# Patient Record
Sex: Female | Born: 1965 | Race: White | Hispanic: No | Marital: Married | Smoking: Never smoker
Health system: Southern US, Community
[De-identification: ages and names within clinical notes are randomized; demographics above are authoritative.]

## PROBLEM LIST (undated history)

## (undated) DIAGNOSIS — G43909 Migraine, unspecified, not intractable, without status migrainosus: Secondary | ICD-10-CM

## (undated) DIAGNOSIS — T7840XA Allergy, unspecified, initial encounter: Secondary | ICD-10-CM

## (undated) DIAGNOSIS — C4490 Unspecified malignant neoplasm of skin, unspecified: Secondary | ICD-10-CM

## (undated) HISTORY — DX: Migraine, unspecified, not intractable, without status migrainosus: G43.909

## (undated) HISTORY — PX: WISDOM TOOTH EXTRACTION: SHX21

## (undated) HISTORY — DX: Unspecified malignant neoplasm of skin, unspecified: C44.90

## (undated) HISTORY — PX: SKIN CANCER EXCISION: SHX779

## (undated) HISTORY — DX: Allergy, unspecified, initial encounter: T78.40XA

---

## 1998-08-18 ENCOUNTER — Other Ambulatory Visit: Admission: RE | Admit: 1998-08-18 | Discharge: 1998-08-18 | Payer: Self-pay | Admitting: Family Medicine

## 1999-09-03 ENCOUNTER — Other Ambulatory Visit: Admission: RE | Admit: 1999-09-03 | Discharge: 1999-09-03 | Payer: Self-pay | Admitting: Family Medicine

## 2000-12-08 ENCOUNTER — Other Ambulatory Visit: Admission: RE | Admit: 2000-12-08 | Discharge: 2000-12-08 | Payer: Self-pay | Admitting: Family Medicine

## 2003-08-02 ENCOUNTER — Encounter: Admission: RE | Admit: 2003-08-02 | Discharge: 2003-08-02 | Payer: Self-pay | Admitting: *Deleted

## 2004-11-06 ENCOUNTER — Encounter: Admission: RE | Admit: 2004-11-06 | Discharge: 2004-11-06 | Payer: Self-pay | Admitting: Family Medicine

## 2006-09-30 ENCOUNTER — Encounter: Admission: RE | Admit: 2006-09-30 | Discharge: 2006-09-30 | Payer: Self-pay | Admitting: Internal Medicine

## 2006-10-07 ENCOUNTER — Encounter: Admission: RE | Admit: 2006-10-07 | Discharge: 2006-10-07 | Payer: Self-pay | Admitting: Internal Medicine

## 2007-12-01 ENCOUNTER — Encounter: Admission: RE | Admit: 2007-12-01 | Discharge: 2007-12-01 | Payer: Self-pay | Admitting: Unknown Physician Specialty

## 2009-01-09 ENCOUNTER — Encounter: Admission: RE | Admit: 2009-01-09 | Discharge: 2009-01-09 | Payer: Self-pay | Admitting: Unknown Physician Specialty

## 2010-01-16 ENCOUNTER — Encounter: Admission: RE | Admit: 2010-01-16 | Discharge: 2010-01-16 | Payer: Self-pay | Admitting: Unknown Physician Specialty

## 2010-12-30 ENCOUNTER — Other Ambulatory Visit: Payer: Self-pay | Admitting: Family Medicine

## 2010-12-30 DIAGNOSIS — Z1231 Encounter for screening mammogram for malignant neoplasm of breast: Secondary | ICD-10-CM

## 2011-02-04 ENCOUNTER — Ambulatory Visit
Admission: RE | Admit: 2011-02-04 | Discharge: 2011-02-04 | Disposition: A | Discharge status: Home / Self Care | Payer: 59 | Source: Ambulatory Visit | Attending: Family Medicine | Admitting: Family Medicine

## 2011-02-04 DIAGNOSIS — Z1231 Encounter for screening mammogram for malignant neoplasm of breast: Secondary | ICD-10-CM

## 2011-02-10 ENCOUNTER — Other Ambulatory Visit: Payer: Self-pay | Admitting: Family Medicine

## 2011-02-10 DIAGNOSIS — R928 Other abnormal and inconclusive findings on diagnostic imaging of breast: Secondary | ICD-10-CM

## 2011-03-03 ENCOUNTER — Ambulatory Visit
Admission: RE | Admit: 2011-03-03 | Discharge: 2011-03-03 | Disposition: A | Discharge status: Home / Self Care | Payer: 59 | Source: Ambulatory Visit | Attending: Family Medicine | Admitting: Family Medicine

## 2011-03-03 DIAGNOSIS — R928 Other abnormal and inconclusive findings on diagnostic imaging of breast: Secondary | ICD-10-CM

## 2012-03-30 ENCOUNTER — Other Ambulatory Visit: Payer: Self-pay | Admitting: Family Medicine

## 2012-03-30 DIAGNOSIS — Z1231 Encounter for screening mammogram for malignant neoplasm of breast: Secondary | ICD-10-CM

## 2012-04-25 ENCOUNTER — Ambulatory Visit
Admission: RE | Admit: 2012-04-25 | Discharge: 2012-04-25 | Disposition: A | Discharge status: Home / Self Care | Payer: 59 | Source: Ambulatory Visit | Attending: Family Medicine | Admitting: Family Medicine

## 2012-04-26 ENCOUNTER — Other Ambulatory Visit: Payer: Self-pay | Admitting: Family Medicine

## 2012-05-08 ENCOUNTER — Ambulatory Visit
Admission: RE | Admit: 2012-05-08 | Discharge: 2012-05-08 | Disposition: A | Discharge status: Home / Self Care | Payer: 59 | Source: Ambulatory Visit | Attending: Family Medicine | Admitting: Family Medicine

## 2012-05-08 DIAGNOSIS — R928 Other abnormal and inconclusive findings on diagnostic imaging of breast: Secondary | ICD-10-CM

## 2013-04-06 ENCOUNTER — Other Ambulatory Visit: Payer: Self-pay

## 2013-04-06 DIAGNOSIS — Z1231 Encounter for screening mammogram for malignant neoplasm of breast: Secondary | ICD-10-CM

## 2013-04-30 ENCOUNTER — Ambulatory Visit
Admission: RE | Admit: 2013-04-30 | Discharge: 2013-04-30 | Disposition: A | Discharge status: Home / Self Care | Payer: 59 | Source: Ambulatory Visit

## 2013-04-30 DIAGNOSIS — Z1231 Encounter for screening mammogram for malignant neoplasm of breast: Secondary | ICD-10-CM

## 2014-02-26 ENCOUNTER — Encounter: Payer: Self-pay | Admitting: Internal Medicine

## 2014-04-24 ENCOUNTER — Ambulatory Visit (AMBULATORY_SURGERY_CENTER): Payer: Self-pay

## 2014-04-24 VITALS — Ht 63.0 in | Wt 148.0 lb

## 2014-04-24 DIAGNOSIS — Z1211 Encounter for screening for malignant neoplasm of colon: Secondary | ICD-10-CM

## 2014-04-24 MED ORDER — MOVIPREP 100 G PO SOLR: 1.0000 | Freq: Once | ORAL | Status: DC

## 2014-04-24 NOTE — Progress Notes (Signed)
No allergies to eggs or soy No past problems with anesthesia No home oxygen No diet/weight loss meds  Has email  Emmi instructions given for colonoscopy 

## 2014-05-07 ENCOUNTER — Encounter: Payer: Self-pay | Admitting: Internal Medicine

## 2014-05-07 ENCOUNTER — Ambulatory Visit (AMBULATORY_SURGERY_CENTER): Payer: 59 | Admitting: Internal Medicine

## 2014-05-07 VITALS — BP 157/93 | HR 70 | Temp 97.4°F | Resp 15 | Ht 63.0 in | Wt 148.0 lb

## 2014-05-07 DIAGNOSIS — Z1211 Encounter for screening for malignant neoplasm of colon: Secondary | ICD-10-CM

## 2014-05-07 MED ORDER — SODIUM CHLORIDE 0.9 % IV SOLN: 500.0000 mL | INTRAVENOUS | Status: DC

## 2014-05-07 NOTE — Progress Notes (Signed)
Pt awake and alert, pleased with MAC

## 2014-05-07 NOTE — Progress Notes (Signed)
No problems noted in the recovery room. maw 

## 2014-05-07 NOTE — Patient Instructions (Signed)
YOU HAD AN ENDOSCOPIC PROCEDURE TODAY AT [facility]:   Refer to the procedure report that was given to you for any specific questions about what was found during the examination.  If the procedure report does not answer your questions, please call your gastroenterologist to clarify.  If you requested that your care partner not be given the details of your procedure findings, then the procedure report has been included in a sealed envelope for you to review at your convenience later.  YOU SHOULD EXPECT: Some feelings of bloating in the abdomen. Passage of more gas than usual.  Walking can help get rid of the air that was put into your GI tract during the procedure and reduce the bloating. If you had a lower endoscopy (such as a colonoscopy or flexible sigmoidoscopy) you may notice spotting of blood in your stool or on the toilet paper. If you underwent a bowel prep for your procedure, you may not have a normal bowel movement for a few days.  Please Note:  You might notice some irritation and congestion in your nose or some drainage.  This is from the oxygen used during your procedure.  There is no need for concern and it should clear up in a day or so.  SYMPTOMS TO REPORT IMMEDIATELY:   Following lower endoscopy (colonoscopy or flexible sigmoidoscopy):  Excessive amounts of blood in the stool  Significant tenderness or worsening of abdominal pains  Swelling of the abdomen that is new, acute  Fever of 100F or higher   For urgent or emergent issues, a gastroenterologist can be reached at any hour by calling [phone].   DIET: Your first meal following the procedure should be a small meal and then it is ok to progress to your normal diet. Heavy or fried foods are harder to digest and may make you feel nauseous or bloated.  Likewise, meals heavy in dairy and vegetables can increase bloating.  Drink plenty of fluids but you should avoid alcoholic beverages for 24  hours.  ACTIVITY:  You should plan to take it easy for the rest of today and you should NOT DRIVE or use heavy machinery until tomorrow (because of the sedation medicines used during the test).    FOLLOW UP: Our staff will call the number listed on your records the next business day following your procedure to check on you and address any questions or concerns that you may have regarding the information given to you following your procedure. If we do not reach you, we will leave a message.  However, if you are feeling well and you are not experiencing any problems, there is no need to return our call.  We will assume that you have returned to your regular daily activities without incident.  If any biopsies were taken you will be contacted by phone or by letter within the next 1-3 weeks.  Please call us at [phone] if you have not heard about the biopsies in 3 weeks.    SIGNATURES/CONFIDENTIALITY: You and/or your care partner have signed paperwork which will be entered into your electronic medical record.  These signatures attest to the fact that that the information above on your After Visit Summary has been reviewed and is understood.  Full responsibility of the confidentiality of this discharge information lies with you and/or your care-partner.    Handout was given to your care partner on a high fiber diet with liberal fluid intake You might notice some irritation in  your nose or drainage.  This may cause feelings of congestion.  This is from the oxygen, which can be drying.  This is no cause for concern; this should clear up in a few days.  You may resume your current medications today. Please call if any questions or concerns.

## 2014-05-07 NOTE — Progress Notes (Signed)
No egg or soy allergy. No issues with past sedation but pt states never been put all the way asleep before No diet pills No home 02 use

## 2014-05-07 NOTE — Op Note (Signed)
[facility]  [address] [place], [id number]   COLONOSCOPY PROCEDURE REPORT  PATIENT: Gina Wyatt, Gina Wyatt  MR#: [id number] BIRTHDATE: 1965/06/16 , 48  yrs. old GENDER: female ENDOSCOPIST: Lafayette Dragon, MD REFERRED BY:Dr L.Helene Kelp, Dr Sundra Aland PROCEDURE DATE:  05/07/2014 PROCEDURE:   Colonoscopy, screening First Screening Colonoscopy - Avg.  risk and is 50 yrs.  old or older Yes.  Prior Negative Screening - Now for repeat screening. N/A  History of Adenoma - Now for follow-up colonoscopy & has been > or = to 3 yrs.  N/A ASA CLASS:   Class I INDICATIONS:Screening for colonic neoplasia and Colorectal Neoplasm Risk Assessment for this procedure is average risk. MEDICATIONS: Monitored anesthesia care and Propofol 250 mg IV  DESCRIPTION OF PROCEDURE:   After the risks benefits and alternatives of the procedure were thoroughly explained, informed consent was obtained.  The digital rectal exam revealed no abnormalities of the rectum.   The LB PCF Q180 [id number]  endoscope was introduced through the anus and advanced to the cecum, which was identified by both the appendix and ileocecal valve. No adverse events experienced.   The quality of the prep was excellent. (MoviPrep was used)  The instrument was then slowly withdrawn as the colon was fully examined.      COLON FINDINGS: A normal appearing cecum, ileocecal valve, and appendiceal orifice were identified.  The ascending, transverse, descending, sigmoid colon, and rectum appeared unremarkable. Retroflexed views revealed no abnormalities. The time to cecum = 5.14 Withdrawal time = 6.00   The scope was withdrawn and the procedure completed. COMPLICATIONS: There were no immediate complications.  ENDOSCOPIC IMPRESSION: Normal colonoscopy  RECOMMENDATIONS: High fiber diet Recall colonoscopy in 10 years  eSigned:  Lafayette Dragon, MD 05/07/2014 9:43 AM   cc:

## 2014-05-08 ENCOUNTER — Telehealth: Payer: Self-pay | Admitting: *Deleted

## 2014-05-08 NOTE — Telephone Encounter (Signed)
  Follow up Call-  Call back number 05/07/2014  Post procedure Call Back phone  # [phone]  Permission to leave phone message Yes     Patient questions:  Do you have a fever, pain , or abdominal swelling? No. Pain Score  0 *  Have you tolerated food without any problems? Yes.    Have you been able to return to your normal activities? Yes.    Do you have any questions about your discharge instructions: Diet   No. Medications  No. Follow up visit  No.  Do you have questions or concerns about your Care? No.  Actions: * If pain score is 4 or above: No action needed, pain <4.

## 2014-06-13 ENCOUNTER — Other Ambulatory Visit: Payer: Self-pay

## 2014-06-13 DIAGNOSIS — Z1231 Encounter for screening mammogram for malignant neoplasm of breast: Secondary | ICD-10-CM

## 2014-06-17 ENCOUNTER — Ambulatory Visit
Admission: RE | Admit: 2014-06-17 | Discharge: 2014-06-17 | Disposition: A | Discharge status: Home / Self Care | Payer: 59 | Source: Ambulatory Visit

## 2014-06-17 DIAGNOSIS — Z1231 Encounter for screening mammogram for malignant neoplasm of breast: Secondary | ICD-10-CM

## 2015-07-09 ENCOUNTER — Other Ambulatory Visit: Payer: Self-pay

## 2015-07-09 DIAGNOSIS — Z1231 Encounter for screening mammogram for malignant neoplasm of breast: Secondary | ICD-10-CM

## 2015-07-14 ENCOUNTER — Ambulatory Visit: Payer: 59

## 2015-07-28 ENCOUNTER — Ambulatory Visit
Admission: RE | Admit: 2015-07-28 | Discharge: 2015-07-28 | Disposition: A | Discharge status: Home / Self Care | Payer: Commercial Managed Care - HMO | Source: Ambulatory Visit

## 2015-07-28 DIAGNOSIS — Z1231 Encounter for screening mammogram for malignant neoplasm of breast: Secondary | ICD-10-CM

## 2016-03-10 DIAGNOSIS — Z1389 Encounter for screening for other disorder: Secondary | ICD-10-CM | POA: Diagnosis not present

## 2016-03-10 DIAGNOSIS — Z0000 Encounter for general adult medical examination without abnormal findings: Secondary | ICD-10-CM | POA: Diagnosis not present

## 2016-03-24 DIAGNOSIS — Z23 Encounter for immunization: Secondary | ICD-10-CM | POA: Diagnosis not present

## 2016-08-31 ENCOUNTER — Other Ambulatory Visit: Payer: Self-pay | Admitting: Family Medicine

## 2016-08-31 DIAGNOSIS — Z1231 Encounter for screening mammogram for malignant neoplasm of breast: Secondary | ICD-10-CM

## 2016-09-29 ENCOUNTER — Ambulatory Visit
Admission: RE | Admit: 2016-09-29 | Discharge: 2016-09-29 | Disposition: A | Discharge status: Home / Self Care | Payer: 59 | Source: Ambulatory Visit | Attending: Family Medicine | Admitting: Family Medicine

## 2016-09-29 DIAGNOSIS — Z1231 Encounter for screening mammogram for malignant neoplasm of breast: Secondary | ICD-10-CM | POA: Diagnosis not present

## 2017-02-23 DIAGNOSIS — E559 Vitamin D deficiency, unspecified: Secondary | ICD-10-CM | POA: Diagnosis not present

## 2017-02-23 DIAGNOSIS — G43009 Migraine without aura, not intractable, without status migrainosus: Secondary | ICD-10-CM | POA: Diagnosis not present

## 2017-02-23 DIAGNOSIS — N951 Menopausal and female climacteric states: Secondary | ICD-10-CM | POA: Diagnosis not present

## 2017-03-21 DIAGNOSIS — Z23 Encounter for immunization: Secondary | ICD-10-CM | POA: Diagnosis not present

## 2017-03-21 DIAGNOSIS — Z0000 Encounter for general adult medical examination without abnormal findings: Secondary | ICD-10-CM | POA: Diagnosis not present

## 2017-04-15 DIAGNOSIS — J329 Chronic sinusitis, unspecified: Secondary | ICD-10-CM | POA: Diagnosis not present

## 2017-05-23 DIAGNOSIS — J329 Chronic sinusitis, unspecified: Secondary | ICD-10-CM | POA: Diagnosis not present

## 2017-09-22 ENCOUNTER — Other Ambulatory Visit: Payer: Self-pay | Admitting: Family Medicine

## 2017-09-22 DIAGNOSIS — Z1231 Encounter for screening mammogram for malignant neoplasm of breast: Secondary | ICD-10-CM

## 2017-10-20 ENCOUNTER — Ambulatory Visit: Payer: 59

## 2017-11-01 ENCOUNTER — Ambulatory Visit
Admission: RE | Admit: 2017-11-01 | Discharge: 2017-11-01 | Disposition: A | Discharge status: Home / Self Care | Payer: 59 | Source: Ambulatory Visit | Attending: Family Medicine | Admitting: Family Medicine

## 2017-11-01 DIAGNOSIS — Z1231 Encounter for screening mammogram for malignant neoplasm of breast: Secondary | ICD-10-CM | POA: Diagnosis not present

## 2018-03-23 DIAGNOSIS — Z01419 Encounter for gynecological examination (general) (routine) without abnormal findings: Secondary | ICD-10-CM | POA: Diagnosis not present

## 2018-03-23 DIAGNOSIS — Z6826 Body mass index (BMI) 26.0-26.9, adult: Secondary | ICD-10-CM | POA: Diagnosis not present

## 2018-03-23 DIAGNOSIS — Z23 Encounter for immunization: Secondary | ICD-10-CM | POA: Diagnosis not present

## 2019-03-13 ENCOUNTER — Other Ambulatory Visit: Payer: Self-pay | Admitting: Family Medicine

## 2019-03-13 ENCOUNTER — Other Ambulatory Visit: Payer: Self-pay | Admitting: Obstetrics and Gynecology

## 2019-03-13 DIAGNOSIS — Z1231 Encounter for screening mammogram for malignant neoplasm of breast: Secondary | ICD-10-CM

## 2019-04-17 ENCOUNTER — Ambulatory Visit
Admission: RE | Admit: 2019-04-17 | Discharge: 2019-04-17 | Disposition: A | Discharge status: Home / Self Care | Payer: 59 | Source: Ambulatory Visit | Attending: Family Medicine | Admitting: Family Medicine

## 2019-04-17 ENCOUNTER — Other Ambulatory Visit: Payer: Self-pay

## 2019-04-17 DIAGNOSIS — Z1231 Encounter for screening mammogram for malignant neoplasm of breast: Secondary | ICD-10-CM

## 2019-04-19 ENCOUNTER — Other Ambulatory Visit: Payer: Self-pay | Admitting: Family Medicine

## 2019-04-19 DIAGNOSIS — R928 Other abnormal and inconclusive findings on diagnostic imaging of breast: Secondary | ICD-10-CM

## 2019-05-04 ENCOUNTER — Ambulatory Visit: Payer: 59

## 2019-05-04 ENCOUNTER — Ambulatory Visit
Admission: RE | Admit: 2019-05-04 | Discharge: 2019-05-04 | Disposition: A | Discharge status: Home / Self Care | Payer: 59 | Source: Ambulatory Visit | Attending: Family Medicine | Admitting: Family Medicine

## 2019-05-04 ENCOUNTER — Other Ambulatory Visit: Payer: Self-pay

## 2019-05-04 DIAGNOSIS — R928 Other abnormal and inconclusive findings on diagnostic imaging of breast: Secondary | ICD-10-CM

## 2020-05-06 ENCOUNTER — Other Ambulatory Visit: Payer: Self-pay | Admitting: Family Medicine

## 2020-05-06 DIAGNOSIS — Z1231 Encounter for screening mammogram for malignant neoplasm of breast: Secondary | ICD-10-CM

## 2020-06-27 ENCOUNTER — Other Ambulatory Visit: Payer: Self-pay

## 2020-06-27 ENCOUNTER — Ambulatory Visit
Admission: RE | Admit: 2020-06-27 | Discharge: 2020-06-27 | Disposition: A | Discharge status: Home / Self Care | Payer: 59 | Source: Ambulatory Visit | Attending: Family Medicine | Admitting: Family Medicine

## 2020-06-27 DIAGNOSIS — Z1231 Encounter for screening mammogram for malignant neoplasm of breast: Secondary | ICD-10-CM

## 2021-06-10 ENCOUNTER — Other Ambulatory Visit: Payer: Self-pay | Admitting: Family Medicine

## 2021-06-10 DIAGNOSIS — Z1231 Encounter for screening mammogram for malignant neoplasm of breast: Secondary | ICD-10-CM

## 2021-07-14 ENCOUNTER — Ambulatory Visit
Admission: RE | Admit: 2021-07-14 | Discharge: 2021-07-14 | Disposition: A | Discharge status: Home / Self Care | Payer: 59 | Source: Ambulatory Visit | Attending: Family Medicine | Admitting: Family Medicine

## 2021-07-14 DIAGNOSIS — Z1231 Encounter for screening mammogram for malignant neoplasm of breast: Secondary | ICD-10-CM

## 2022-06-10 ENCOUNTER — Other Ambulatory Visit: Payer: Self-pay | Admitting: Family Medicine

## 2022-06-10 DIAGNOSIS — Z1231 Encounter for screening mammogram for malignant neoplasm of breast: Secondary | ICD-10-CM

## 2022-07-26 ENCOUNTER — Ambulatory Visit
Admission: RE | Admit: 2022-07-26 | Discharge: 2022-07-26 | Disposition: A | Discharge status: Home / Self Care | Payer: 59 | Source: Ambulatory Visit | Attending: Family Medicine | Admitting: Family Medicine

## 2022-07-26 DIAGNOSIS — Z1231 Encounter for screening mammogram for malignant neoplasm of breast: Secondary | ICD-10-CM

## 2023-06-22 ENCOUNTER — Other Ambulatory Visit: Payer: Self-pay | Admitting: Family Medicine

## 2023-06-22 DIAGNOSIS — Z1231 Encounter for screening mammogram for malignant neoplasm of breast: Secondary | ICD-10-CM

## 2023-08-02 ENCOUNTER — Ambulatory Visit
Admission: RE | Admit: 2023-08-02 | Discharge: 2023-08-02 | Disposition: A | Discharge status: Home / Self Care | Source: Ambulatory Visit | Attending: Family Medicine | Admitting: Family Medicine

## 2023-08-02 DIAGNOSIS — Z1231 Encounter for screening mammogram for malignant neoplasm of breast: Secondary | ICD-10-CM

## 2023-08-05 ENCOUNTER — Other Ambulatory Visit: Payer: Self-pay | Admitting: Family Medicine

## 2023-08-05 DIAGNOSIS — R928 Other abnormal and inconclusive findings on diagnostic imaging of breast: Secondary | ICD-10-CM

## 2023-08-17 ENCOUNTER — Ambulatory Visit
Admission: RE | Admit: 2023-08-17 | Discharge: 2023-08-17 | Discharge status: Home / Self Care | Source: Ambulatory Visit | Attending: Family Medicine | Admitting: Family Medicine

## 2023-08-17 DIAGNOSIS — R928 Other abnormal and inconclusive findings on diagnostic imaging of breast: Secondary | ICD-10-CM

## 2023-11-19 IMAGING — MG MM DIGITAL SCREENING BILAT W/ TOMO AND CAD
8 series · 9 of 24 positions shown · non-contrast
Comparison: Previous exam(s).

CLINICAL DATA: Screening.

EXAM:
DIGITAL SCREENING BILATERAL MAMMOGRAM WITH TOMOSYNTHESIS AND CAD
TECHNIQUE: Bilateral screening digital craniocaudal and mediolateral oblique
mammograms were obtained. Bilateral screening digital breast
tomosynthesis was performed. The images were evaluated with
computer-aided detection.

[R CC synth-2D]
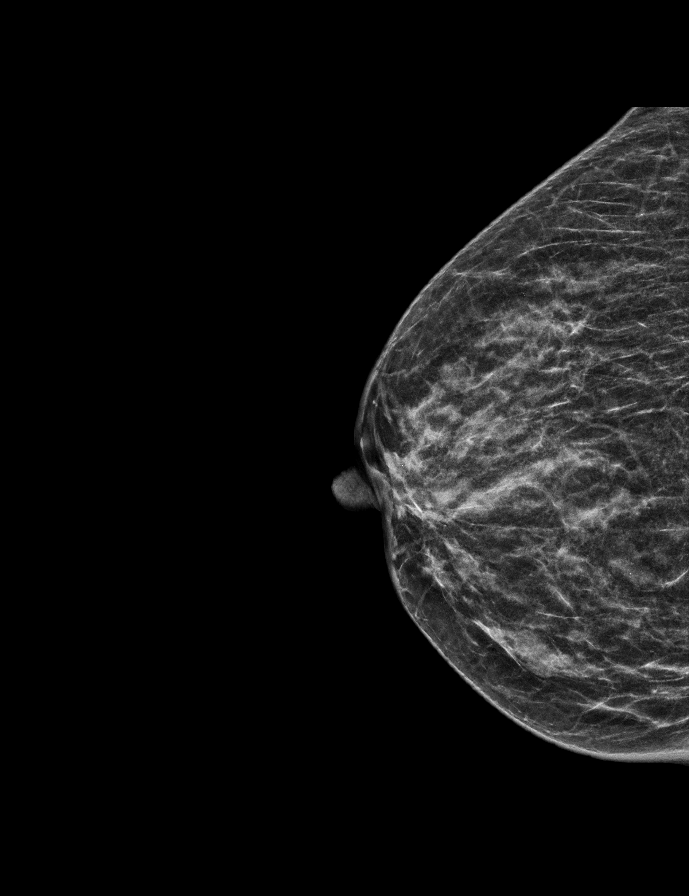

[L CC synth-2D]
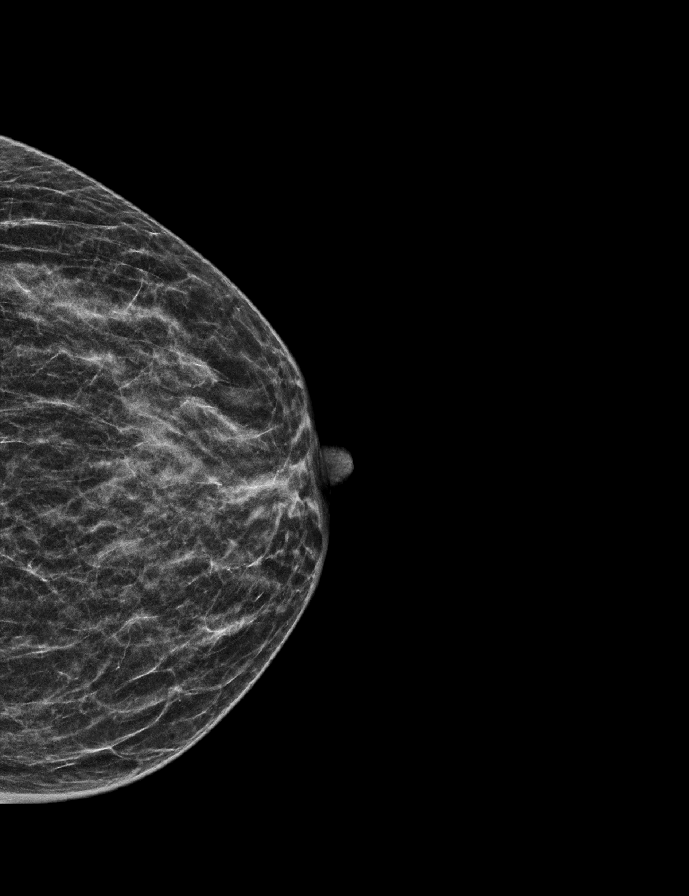

[R MLO synth-2D]
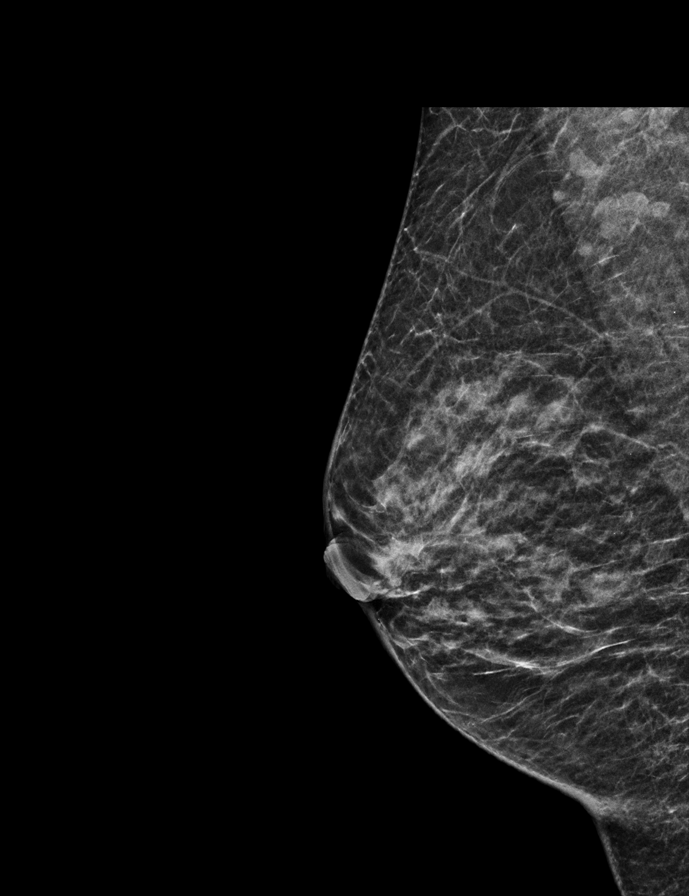

[L MLO synth-2D]
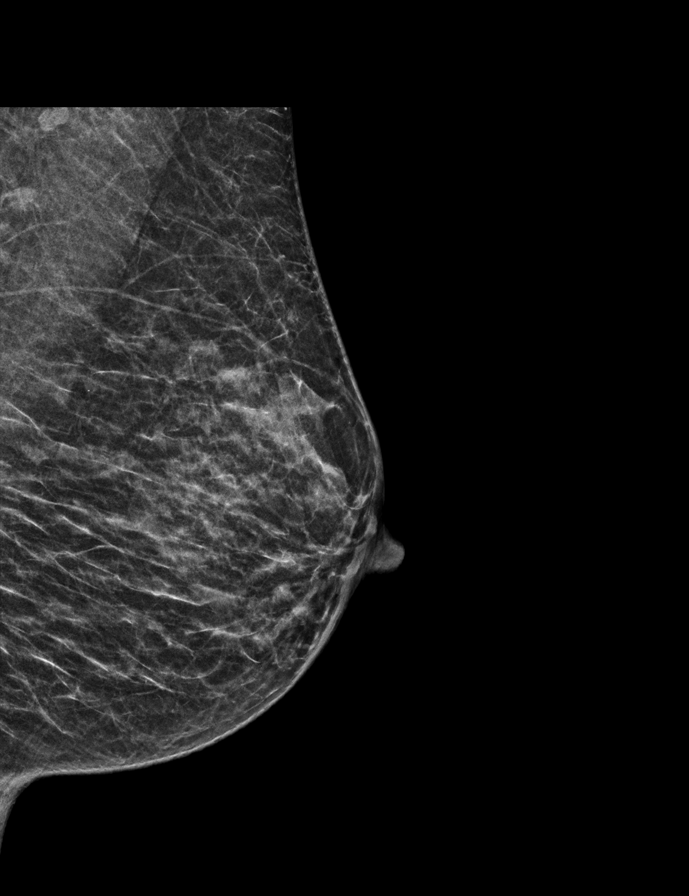

[R MLO tomo · 2 of 35 frames shown]
[frame 12/35]
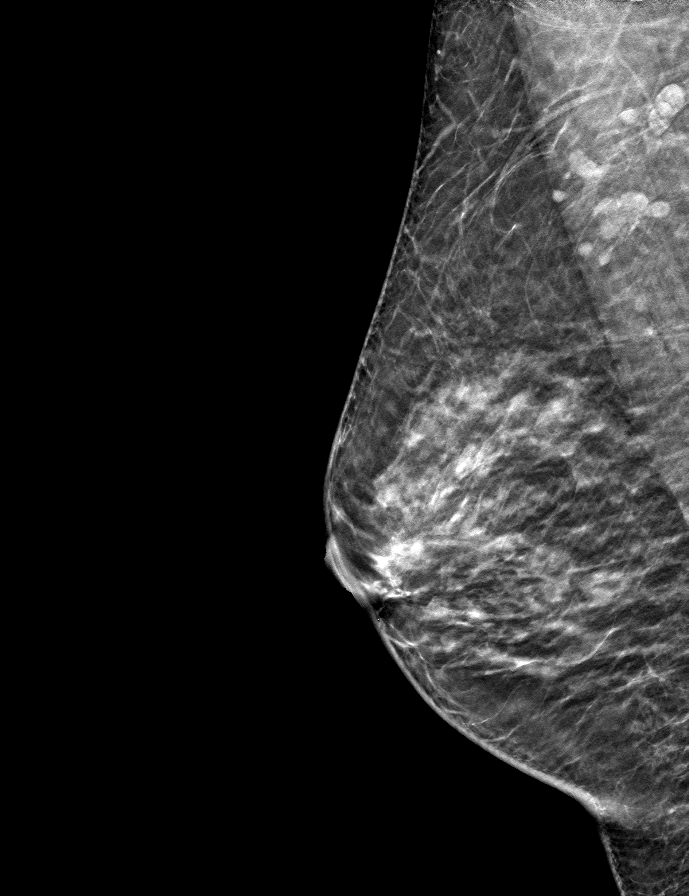
[frame 18/35]
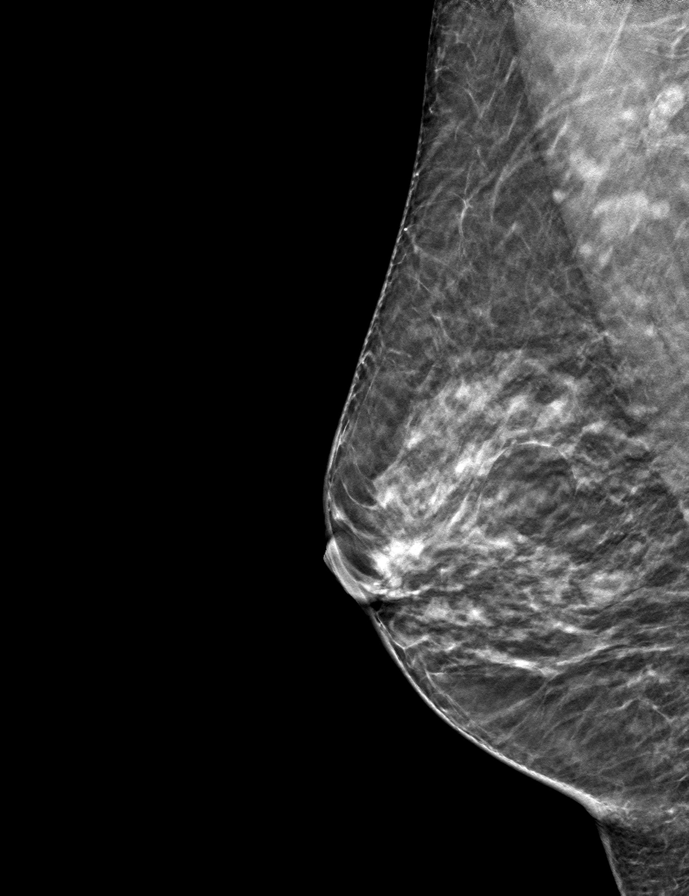

[L MLO tomo · tomo slice 18/35.0]
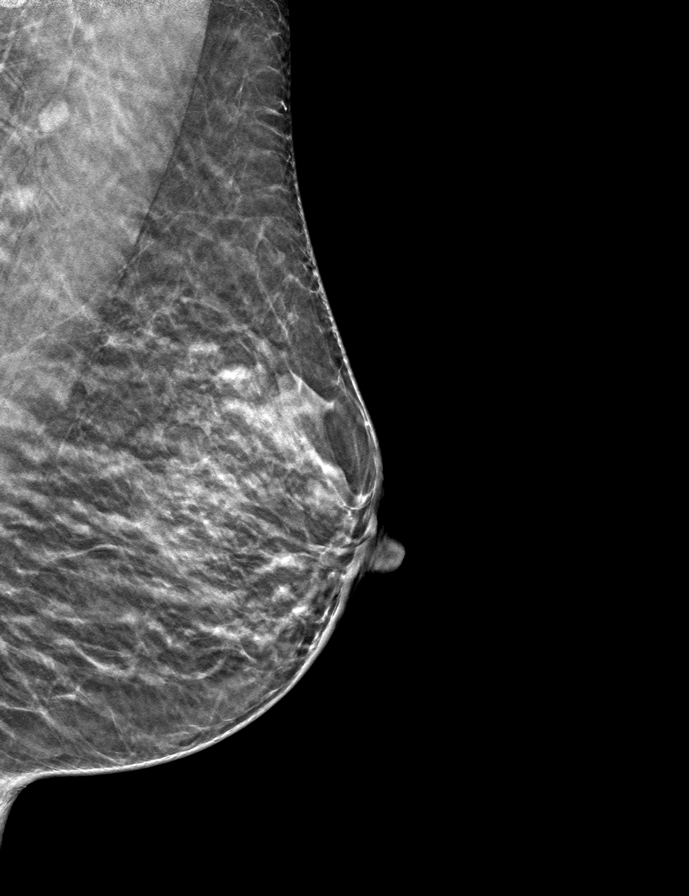

[L CC tomo · tomo slice 17/34.0]
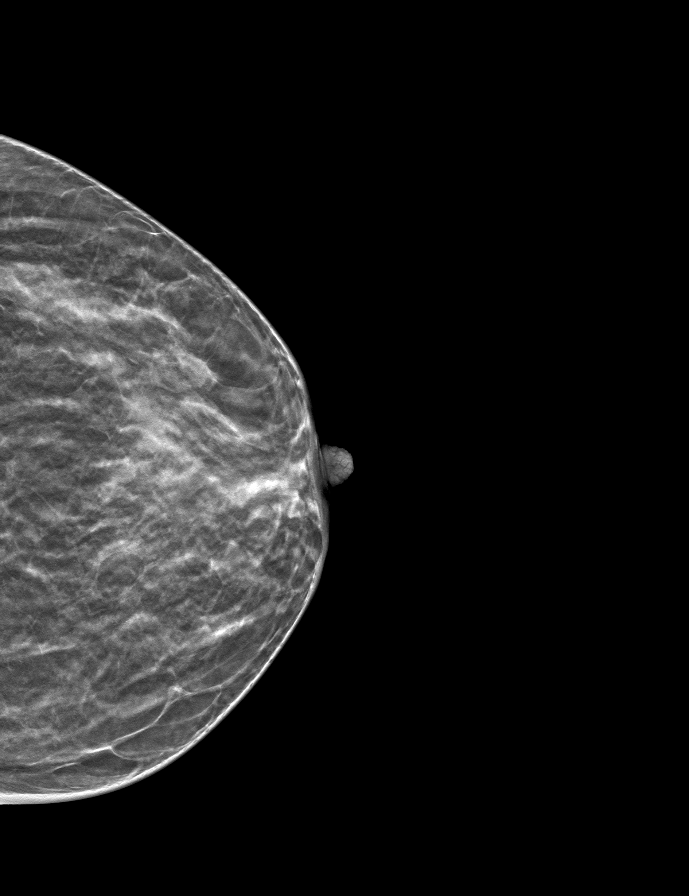

[R CC tomo · tomo slice 18/35.0]
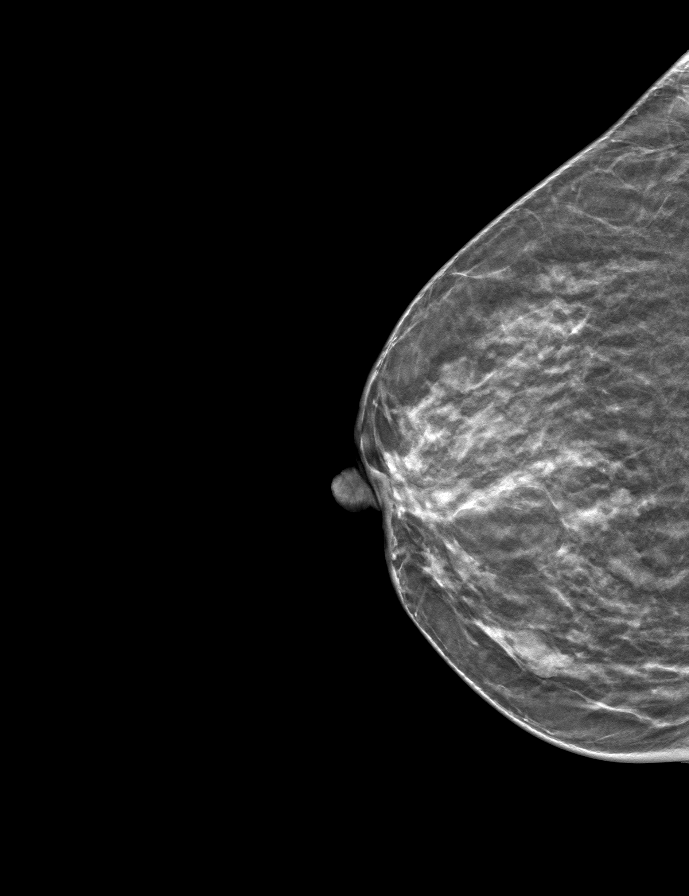

[9 of 24 positions shown; findings below may reference images not displayed]

ACR Breast Density Category c: The breast tissue is heterogeneously
dense, which may obscure small masses.
FINDINGS: There are no findings suspicious for malignancy.
IMPRESSION: No mammographic evidence of malignancy. A result letter of this
screening mammogram will be mailed directly to the patient.

RECOMMENDATION:
Screening mammogram in one year. (Code:Q3-W-BC3)

BI-RADS CATEGORY  1: Negative.
